# Patient Record
Sex: Female | Born: 2000 | Race: White | Hispanic: No | Marital: Single | Smoking: Never smoker
Health system: Southern US, Community
[De-identification: ages and names within clinical notes are randomized; demographics above are authoritative.]

## PROBLEM LIST (undated history)

## (undated) DIAGNOSIS — F411 Generalized anxiety disorder: Secondary | ICD-10-CM

## (undated) DIAGNOSIS — J40 Bronchitis, not specified as acute or chronic: Secondary | ICD-10-CM

## (undated) DIAGNOSIS — Z9109 Other allergy status, other than to drugs and biological substances: Secondary | ICD-10-CM

## (undated) DIAGNOSIS — R519 Headache, unspecified: Secondary | ICD-10-CM

## (undated) DIAGNOSIS — A4902 Methicillin resistant Staphylococcus aureus infection, unspecified site: Secondary | ICD-10-CM

## (undated) DIAGNOSIS — F329 Major depressive disorder, single episode, unspecified: Secondary | ICD-10-CM

## (undated) DIAGNOSIS — N390 Urinary tract infection, site not specified: Secondary | ICD-10-CM

## (undated) DIAGNOSIS — R635 Abnormal weight gain: Secondary | ICD-10-CM

## (undated) DIAGNOSIS — R51 Headache: Secondary | ICD-10-CM

## (undated) DIAGNOSIS — F32A Depression, unspecified: Secondary | ICD-10-CM

## (undated) HISTORY — DX: Bronchitis, not specified as acute or chronic: J40

## (undated) HISTORY — DX: Major depressive disorder, single episode, unspecified: F32.9

## (undated) HISTORY — DX: Depression, unspecified: F32.A

## (undated) HISTORY — DX: Abnormal weight gain: R63.5

## (undated) HISTORY — PX: CYST REMOVAL LEG: SHX6280

## (undated) HISTORY — DX: Generalized anxiety disorder: F41.1

## (undated) HISTORY — PX: CYST REMOVAL HAND: SHX6279

---
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
## 2021-06-02 ENCOUNTER — Emergency Department: Payer: Self-pay

## 2021-06-02 ENCOUNTER — Emergency Department
Admission: EM | Admit: 2021-06-02 | Discharge: 2021-06-02 | Disposition: A | Discharge status: Home / Self Care | Payer: Self-pay | Attending: Emergency Medicine | Admitting: Emergency Medicine

## 2021-06-02 ENCOUNTER — Encounter: Payer: Self-pay | Admitting: Emergency Medicine

## 2021-06-02 ENCOUNTER — Other Ambulatory Visit: Payer: Self-pay

## 2021-06-02 DIAGNOSIS — S93602A Unspecified sprain of left foot, initial encounter: Secondary | ICD-10-CM | POA: Insufficient documentation

## 2021-06-02 DIAGNOSIS — W108XXA Fall (on) (from) other stairs and steps, initial encounter: Secondary | ICD-10-CM | POA: Insufficient documentation

## 2021-06-02 DIAGNOSIS — Y9301 Activity, walking, marching and hiking: Secondary | ICD-10-CM | POA: Insufficient documentation

## 2021-06-02 MED ORDER — TRAMADOL HCL 50 MG PO TABS: 50.0000 mg | ORAL_TABLET | Freq: Four times a day (QID) | ORAL | 0 refills | Status: AC | PRN

## 2021-06-02 MED ORDER — TRAMADOL HCL 50 MG PO TABS: 50.0000 mg | ORAL_TABLET | Freq: Four times a day (QID) | ORAL | 0 refills | Status: DC | PRN

## 2021-06-02 MED ORDER — NAPROXEN 500 MG PO TABS: 500.0000 mg | ORAL_TABLET | Freq: Two times a day (BID) | ORAL | 0 refills | Status: DC

## 2021-06-02 MED ORDER — NAPROXEN 500 MG PO TABS: 500.0000 mg | ORAL_TABLET | Freq: Two times a day (BID) | ORAL | 0 refills | Status: AC

## 2021-06-02 NOTE — Discharge Instructions (Addendum)
Wear the post op shoe for the next week. If unable to bear weight after the week, follow up with podiatry.   Return to the ER for symptoms that change, worsen, or for new concerns.

## 2021-06-02 NOTE — ED Provider Notes (Signed)
[facility] Emergency Department Provider Note ____________________________________________  Time seen: Approximately 6:26 PM  I have reviewed the triage vital signs and the nursing notes.   HISTORY  Chief Complaint Fall and Foot Pain    HPI Emily Chang is a 20 y.o. female who presents to the emergency department for evaluation and treatment of left foot pain after tripping while walking down her steps and twisting her ankle/foot.  Injury occurred few hours prior to arrival.  No relief with Ace bandage.  Pain increases with attempts to bear weight.  Past Medical History:  Diagnosis Date   Anxiety state 05/19/2017   Bronchitis    Depression    diagnosed by therapist in [place]   Environmental allergies    Headache    MRSA (methicillin resistant Staphylococcus aureus)    Urinary tract infection    Weight gain, abnormal    45# in 6 months    Patient Active Problem List   Diagnosis Date Noted   Anxiety state 05/19/2017   Depressed mood 05/19/2017   Migraine without aura and without status migrainosus, not intractable 02/15/2017   Tension headache 02/15/2017   UTI (urinary tract infection) 03/29/2015    Past Surgical History:  Procedure Laterality Date   CYST REMOVAL HAND     CYST REMOVAL LEG      Prior to Admission medications   Medication Sig Start Date End Date Taking? Authorizing Provider  albuterol (PROVENTIL HFA;VENTOLIN HFA) 108 (90 Base) MCG/ACT inhaler Inhale 2 puffs into the lungs every 6 (six) hours as needed for wheezing or shortness of breath.    [provider]  beclomethasone (QVAR) 40 MCG/ACT inhaler Inhale 2 puffs into the lungs 2 (two) times daily.    [provider]  Cetirizine HCl (ZYRTEC ALLERGY PO) Take by mouth.    [provider]  etonogestrel (NEXPLANON) 68 MG IMPL implant Inject into the skin.    [provider]  fexofenadine-pseudoephedrine (ALLEGRA-D) 60-120 MG 12 hr tablet Take 1  tablet by mouth 2 (two) times daily. Patient not taking: Reported on 05/19/2017 10/18/16   Joni Reining, PA-C  fluticasone [facility]) 50 MCG/ACT nasal spray 1 spray by Each Nare route daily.    [provider]  fluticasone (FLOVENT HFA) 44 MCG/ACT inhaler Inhale into the lungs.    [provider]  Magnesium Oxide 500 MG TABS Take by mouth.    [provider]  montelukast (SINGULAIR) 10 MG tablet Take by mouth.    [provider]  naproxen (NAPROSYN) 500 MG tablet Take 1 tablet (500 mg total) by mouth 2 (two) times daily with a meal. 06/02/21   Trelon Plush B, FNP  riboflavin (VITAMIN B-2) 100 MG TABS tablet Take 100 mg by mouth daily.    [provider]  topiramate (TOPAMAX) 50 MG tablet Take 1 tablet (50 mg total) by mouth 2 (two) times daily. 05/19/17   Keturah Shavers, MD  traMADol (ULTRAM) 50 MG tablet Take 1 tablet (50 mg total) by mouth every 6 (six) hours as needed. 06/02/21   Chinita Pester, FNP    Allergies Iodine and Shellfish allergy  Family History  Problem Relation Age of Onset   Autism Brother    Seizures Brother    Stroke Maternal Grandmother    Cancer Maternal Grandmother    Diabetes Maternal Grandmother    Hypertension Maternal Grandmother    Hypertension Maternal Grandfather    Hypertension Paternal Grandmother    Cancer Paternal Grandfather  Hypertension Paternal Grandfather     Social History Social History   Tobacco Use   Smoking status: Never   Smokeless tobacco: Never  Substance Use Topics   Alcohol use: No   Drug use: No    Review of Systems Constitutional: Negative for fever. Cardiovascular: Negative for chest pain. Respiratory: Negative for shortness of breath. Musculoskeletal: Positive for left foot pain.  Negative for left ankle or knee pain. Skin: Negative for open wounds or lesions overlying the left foot and ankle. Neurological: Negative for decrease in  sensation  ____________________________________________   PHYSICAL EXAM:  VITAL SIGNS: ED Triage Vitals  Enc Vitals Group     BP 06/02/21 1730 120/66     Pulse Rate 06/02/21 1730 70     Resp 06/02/21 1730 20     Temp 06/02/21 1730 98 F (36.7 C)     Temp Source 06/02/21 1730 Oral     SpO2 06/02/21 1730 97 %     Weight 06/02/21 1726 250 lb (113.4 kg)     Height 06/02/21 1726 5\' 2"  (1.575 m)     Head Circumference --      Peak Flow --      Pain Score 06/02/21 1726 8     Pain Loc --      Pain Edu? --      Excl. in GC? --     Constitutional: Alert and oriented. Well appearing and in no acute distress. Eyes: Conjunctivae are clear without discharge or drainage Head: Atraumatic Neck: Supple. Respiratory: No cough. Respirations are even and unlabored. Musculoskeletal: Tenderness over the fourth and fifth metatarsals without obvious bony injury or deformity.  No tenderness over the left ankle.  Able to demonstrate full range of motion.  No tenderness to the left knee.  Demonstrates full range of motion. Neurologic: Awake, alert, oriented.  Motor and sensory function of the left lower extremity is intact. Skin: No open wounds or lesions over the left foot and ankle. Psychiatric: Affect and behavior are appropriate.  ____________________________________________   LABS (all labs ordered are listed, but only abnormal results are displayed)  Labs Reviewed - No data to display ____________________________________________  RADIOLOGY  Image of the left foot is negative for acute concerns.  I, 06/04/21, personally viewed and evaluated these images (plain radiographs) as part of my medical decision making, as well as reviewing the written report by the radiologist.  DG Foot Complete Left  Result Date: 06/02/2021 CLINICAL DATA:  Pain after fall today. Twisting injury with left foot pain. EXAM: LEFT FOOT - COMPLETE 3+ VIEW COMPARISON:  None. FINDINGS: There is no evidence of  fracture or dislocation. Incidental os navicular. Tiny plantar calcaneal spur. There is no evidence of arthropathy or other focal bone abnormality. Soft tissues are unremarkable. IMPRESSION: No fracture or subluxation of the left foot. Electronically Signed   By: 06/04/2021 M.D.   On: 06/02/2021 18:55   ____________________________________________   PROCEDURES  Procedures  ____________________________________________   INITIAL IMPRESSION / ASSESSMENT AND PLAN / ED COURSE  Emily Chang is a 20 y.o. who presents to the emergency department for treatment and evaluation of left foot injury.  See HPI for further details.  Image of the left foot shows no acute bony abnormality.  She will be placed in a Ace bandage and postop shoe.  Patient instructed to follow-up with podiatry if not improving over the week.  She was also instructed to return to the emergency department for symptoms that change  or worsen if unable schedule an appointment with podiatry or primary care.  Medications - No data to display  Pertinent labs & imaging results that were available during my care of the patient were reviewed by me and considered in my medical decision making (see chart for details).   _________________________________________   FINAL CLINICAL IMPRESSION(S) / ED DIAGNOSES  Final diagnoses:  Sprain of left foot, initial encounter    ED Discharge Orders          Ordered    traMADol (ULTRAM) 50 MG tablet  Every 6 hours PRN,   Status:  Discontinued        06/02/21 1929    naproxen (NAPROSYN) 500 MG tablet  2 times daily with meals,   Status:  Discontinued        06/02/21 1929    naproxen (NAPROSYN) 500 MG tablet  2 times daily with meals        06/02/21 1949    traMADol (ULTRAM) 50 MG tablet  Every 6 hours PRN        06/02/21 1949             If controlled substance prescribed during this visit, 12 month history viewed on the [facility] prior to issuing an initial prescription for  Schedule II or III opiod.    Chinita Pester, FNP 06/02/21 1953    Jene Every, MD 06/02/21 2006

## 2021-06-02 NOTE — ED Triage Notes (Signed)
Pt reports fell and hurt her left foot today. Pt states it is very painful to walk on.

## 2021-06-02 NOTE — ED Notes (Signed)
See triage note  Presents with pain to left foot  States she fell  Twisted foot  No deformity noted  Unable to bear wt d/t pain

## 2021-07-22 ENCOUNTER — Other Ambulatory Visit: Payer: Self-pay

## 2021-07-22 ENCOUNTER — Emergency Department
Admission: EM | Admit: 2021-07-22 | Discharge: 2021-07-22 | Disposition: A | Discharge status: Home / Self Care | Payer: Self-pay | Attending: Emergency Medicine | Admitting: Emergency Medicine

## 2021-07-22 DIAGNOSIS — M6740 Ganglion, unspecified site: Secondary | ICD-10-CM | POA: Insufficient documentation

## 2021-07-22 NOTE — Discharge Instructions (Signed)
You can take 600 mg of ibuprofen every 8 hours as needed for discomfort.

## 2021-07-22 NOTE — ED Triage Notes (Signed)
C/O bump to anterior wrist and base of thumb x 1 month.  Arrives today to be checked out.

## 2021-07-22 NOTE — ED Provider Notes (Signed)
[facility]-EMERGENCY DEPARTMENT  ____________________________________________  Time seen: Approximately 9:09 PM  I have reviewed the triage vital signs and the nursing notes.   HISTORY  Chief Complaint Hand Pain   Historian Patient    HPI Emily Chang is a 20 y.o. female presents to the emergency department with a 3 cm x 2 cm ganglion cyst that has developed over the past 3 weeks.  Patient states that the area is occasionally tender to palpation and she became concerned.  A soft and movable.  Patient denies a history of similar symptoms in the past.  She has not been under the care of orthopedics.   Past Medical History:  Diagnosis Date   Anxiety state 05/19/2017   Bronchitis    Depression    diagnosed by therapist in [place]   Environmental allergies    Headache    MRSA (methicillin resistant Staphylococcus aureus)    Urinary tract infection    Weight gain, abnormal    45# in 6 months     Immunizations up to date:  Yes.     Past Medical History:  Diagnosis Date   Anxiety state 05/19/2017   Bronchitis    Depression    diagnosed by therapist in [place]   Environmental allergies    Headache    MRSA (methicillin resistant Staphylococcus aureus)    Urinary tract infection    Weight gain, abnormal    45# in 6 months    Patient Active Problem List   Diagnosis Date Noted   Anxiety state 05/19/2017   Depressed mood 05/19/2017   Migraine without aura and without status migrainosus, not intractable 02/15/2017   Tension headache 02/15/2017   UTI (urinary tract infection) 03/29/2015    Past Surgical History:  Procedure Laterality Date   CYST REMOVAL HAND     CYST REMOVAL LEG      Prior to Admission medications   Medication Sig Start Date End Date Taking? Authorizing Provider  albuterol (PROVENTIL HFA;VENTOLIN HFA) 108 (90 Base) MCG/ACT inhaler Inhale 2 puffs into the lungs every 6 (six) hours as needed for wheezing or shortness of breath.    [provider]  beclomethasone (QVAR) 40 MCG/ACT inhaler Inhale 2 puffs into the lungs 2 (two) times daily.    [provider]  Cetirizine HCl (ZYRTEC ALLERGY PO) Take by mouth.    [provider]  etonogestrel (NEXPLANON) 68 MG IMPL implant Inject into the skin.    [provider]  fexofenadine-pseudoephedrine (ALLEGRA-D) 60-120 MG 12 hr tablet Take 1 tablet by mouth 2 (two) times daily. Patient not taking: Reported on 05/19/2017 10/18/16   Joni Reining, PA-C  fluticasone [facility]) 50 MCG/ACT nasal spray 1 spray by Each Nare route daily.    [provider]  fluticasone (FLOVENT HFA) 44 MCG/ACT inhaler Inhale into the lungs.    [provider]  Magnesium Oxide 500 MG TABS Take by mouth.    [provider]  montelukast (SINGULAIR) 10 MG tablet Take by mouth.    [provider]  naproxen (NAPROSYN) 500 MG tablet Take 1 tablet (500 mg total) by mouth 2 (two) times daily with a meal. 06/02/21   Triplett, Cari B, FNP  riboflavin (VITAMIN B-2) 100 MG TABS tablet Take 100 mg by mouth daily.    [provider]  topiramate (TOPAMAX) 50 MG tablet Take 1 tablet (50 mg total) by mouth 2 (two) times daily. 05/19/17   Keturah Shavers, MD  traMADol [facility]) 50 MG tablet Take  1 tablet (50 mg total) by mouth every 6 (six) hours as needed. 06/02/21   Kem Boroughs B, FNP    Allergies Iodine and Shellfish allergy  Family History  Problem Relation Age of Onset   Autism Brother    Seizures Brother    Stroke Maternal Grandmother    Cancer Maternal Grandmother    Diabetes Maternal Grandmother    Hypertension Maternal Grandmother    Hypertension Maternal Grandfather    Hypertension Paternal Grandmother    Cancer Paternal Grandfather    Hypertension Paternal Grandfather     Social History Social History   Tobacco Use   Smoking status: Never   Smokeless tobacco: Never  Substance Use Topics   Alcohol use: No   Drug use: No      Review of Systems  Constitutional: No fever/chills Eyes:  No discharge ENT: No upper respiratory complaints. Respiratory: no cough. No SOB/ use of accessory muscles to breath Gastrointestinal:   No nausea, no vomiting.  No diarrhea.  No constipation. Musculoskeletal: Negative for musculoskeletal pain. Skin: Negative for rash, abrasions, lacerations, ecchymosis.   ____________________________________________   PHYSICAL EXAM:  VITAL SIGNS: ED Triage Vitals  Enc Vitals Group     BP 07/22/21 1813 (!) 141/86     Pulse Rate 07/22/21 1813 77     Resp 07/22/21 1813 18     Temp 07/22/21 1813 (!) 97.4 F (36.3 C)     Temp Source 07/22/21 1813 Oral     SpO2 07/22/21 1813 99 %     Weight 07/22/21 1803 250 lb (113.4 kg)     Height 07/22/21 1803 5\' 2"  (1.575 m)     Head Circumference --      Peak Flow --      Pain Score 07/22/21 1802 6     Pain Loc --      Pain Edu? --      Excl. in GC? --      Constitutional: Alert and oriented. Well appearing and in no acute distress. Eyes: Conjunctivae are normal. PERRL. EOMI. Head: Atraumatic. ENT: Cardiovascular: Normal rate, regular rhythm. Normal S1 and S2.  Good peripheral circulation. Respiratory: Normal respiratory effort without tachypnea or retractions. Lungs CTAB. Good air entry to the bases with no decreased or absent breath sounds Gastrointestinal: Bowel sounds x 4 quadrants. Soft and nontender to palpation. No guarding or rigidity. No distention. Musculoskeletal: Full range of motion to all extremities. No obvious deformities noted Neurologic:  Normal for age. No gross focal neurologic deficits are appreciated.  Skin: Patient has a 3 cm x 2 cm soft tissue mass along the volar aspect of the left wrist that is soft and movable to palpation. Psychiatric: Mood and affect are normal for age. Speech and behavior are normal.   ____________________________________________   LABS (all labs ordered are listed, but only abnormal  results are displayed)  Labs Reviewed - No data to display ____________________________________________  EKG   ____________________________________________  RADIOLOGY   No results found.  ____________________________________________    PROCEDURES  Procedure(s) performed:     Procedures     Medications - No data to display   ____________________________________________   INITIAL IMPRESSION / ASSESSMENT AND PLAN / ED COURSE  Pertinent labs & imaging results that were available during my care of the patient were reviewed by me and considered in my medical decision making (see chart for details).      Assessment and plan Ganglion cyst 20 year old female presents to the emergency department with a  3 cm x 2 cm soft tissue mass along the volar aspect of the left wrist concerning for possible ganglion cyst.  Patient was advised to follow-up with hand specialist, Dr. Stephenie Acres.  Recommended 600 mg of ibuprofen every 6 hours as needed for discomfort.  All patient questions were answered.     ____________________________________________  FINAL CLINICAL IMPRESSION(S) / ED DIAGNOSES  Final diagnoses:  Ganglion cyst      NEW MEDICATIONS STARTED DURING THIS VISIT:  ED Discharge Orders     None           This chart was dictated using voice recognition software/Dragon. Despite best efforts to proofread, errors can occur which can change the meaning. Any change was purely unintentional.     Orvil Feil, PA-C 07/22/21 2111    Merwyn Katos, MD 07/22/21 2152

## 2021-09-22 ENCOUNTER — Ambulatory Visit: Payer: Self-pay

## 2021-10-16 ENCOUNTER — Ambulatory Visit: Payer: Self-pay

## 2023-02-03 IMAGING — DX DG FOOT COMPLETE 3+V*L*
3 series · 3 of 3 positions shown · non-contrast
Comparison: None.

CLINICAL DATA: Pain after fall today. Twisting injury with left
foot pain.

EXAM:
LEFT FOOT - COMPLETE 3+ VIEW

[foot ap]
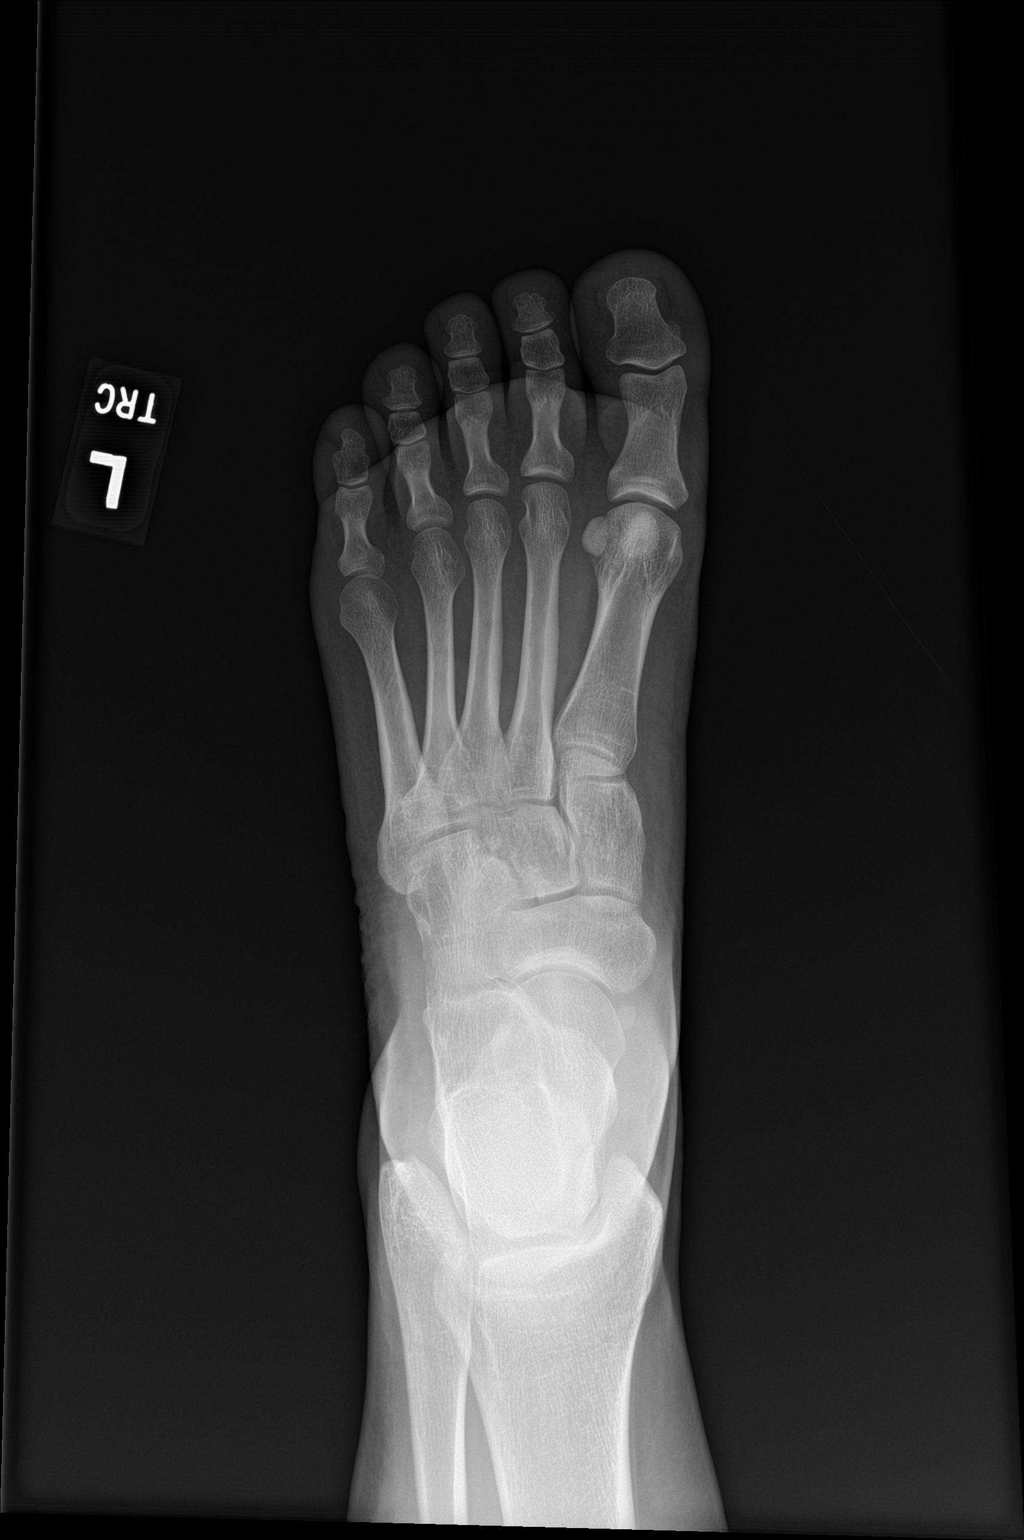

[foot obl]
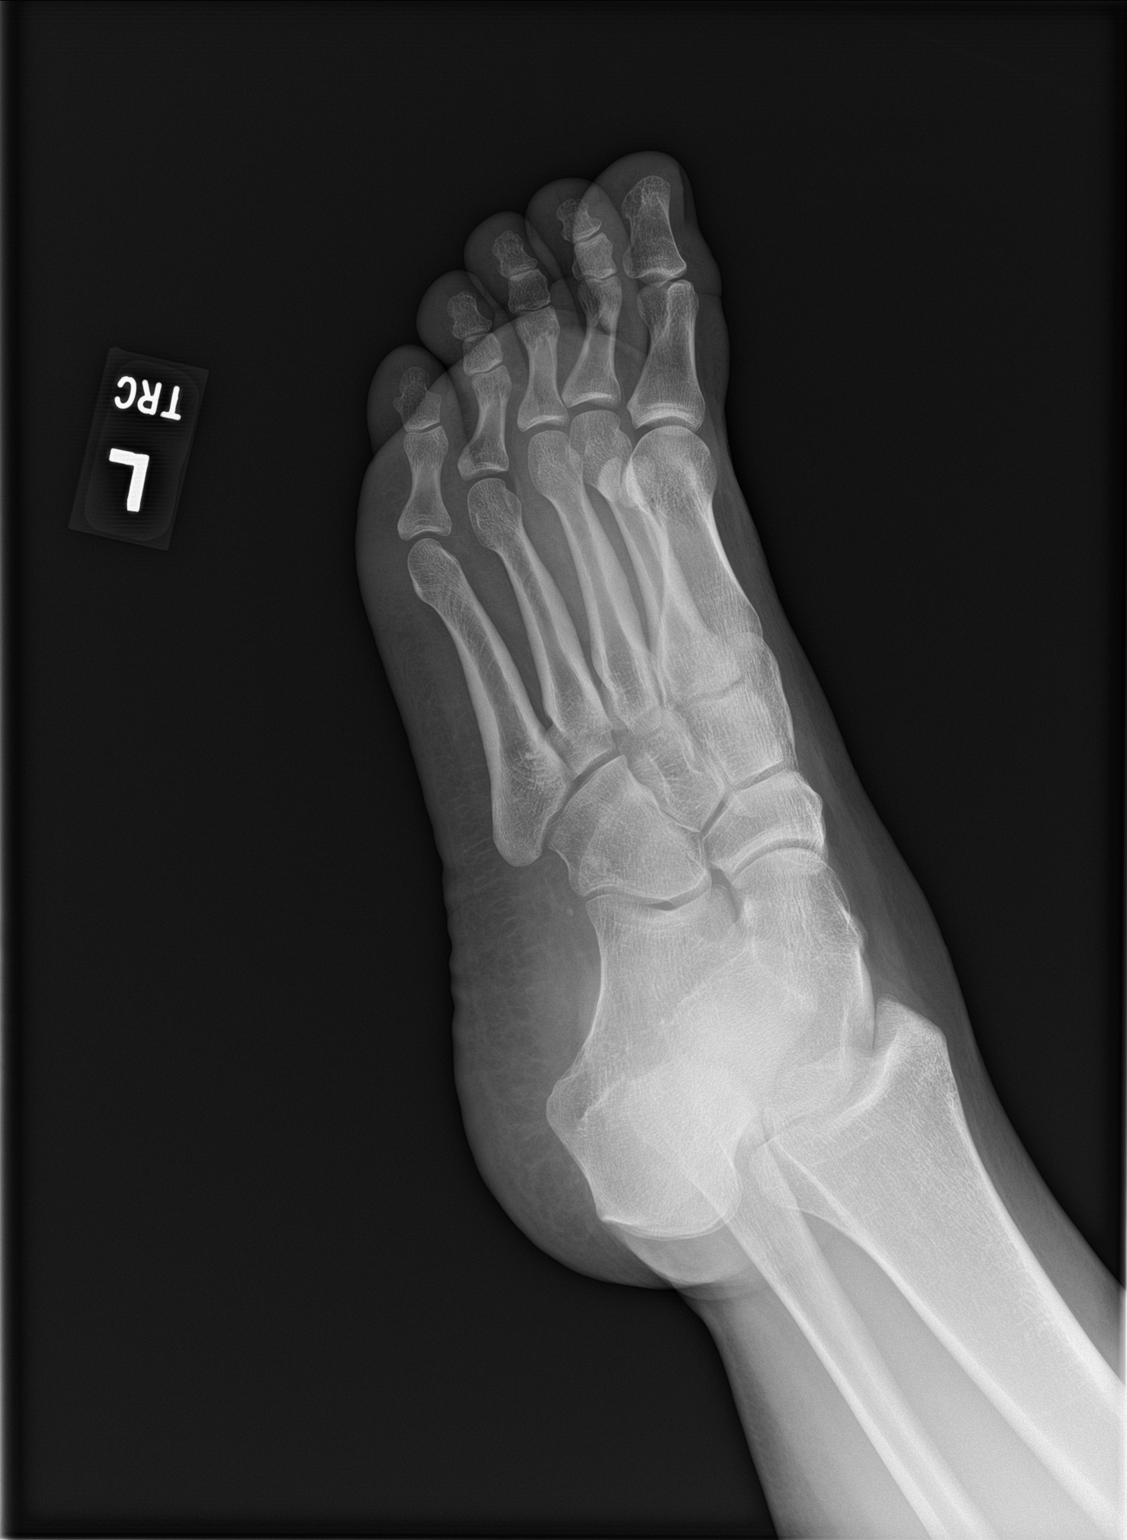

[foot lat]
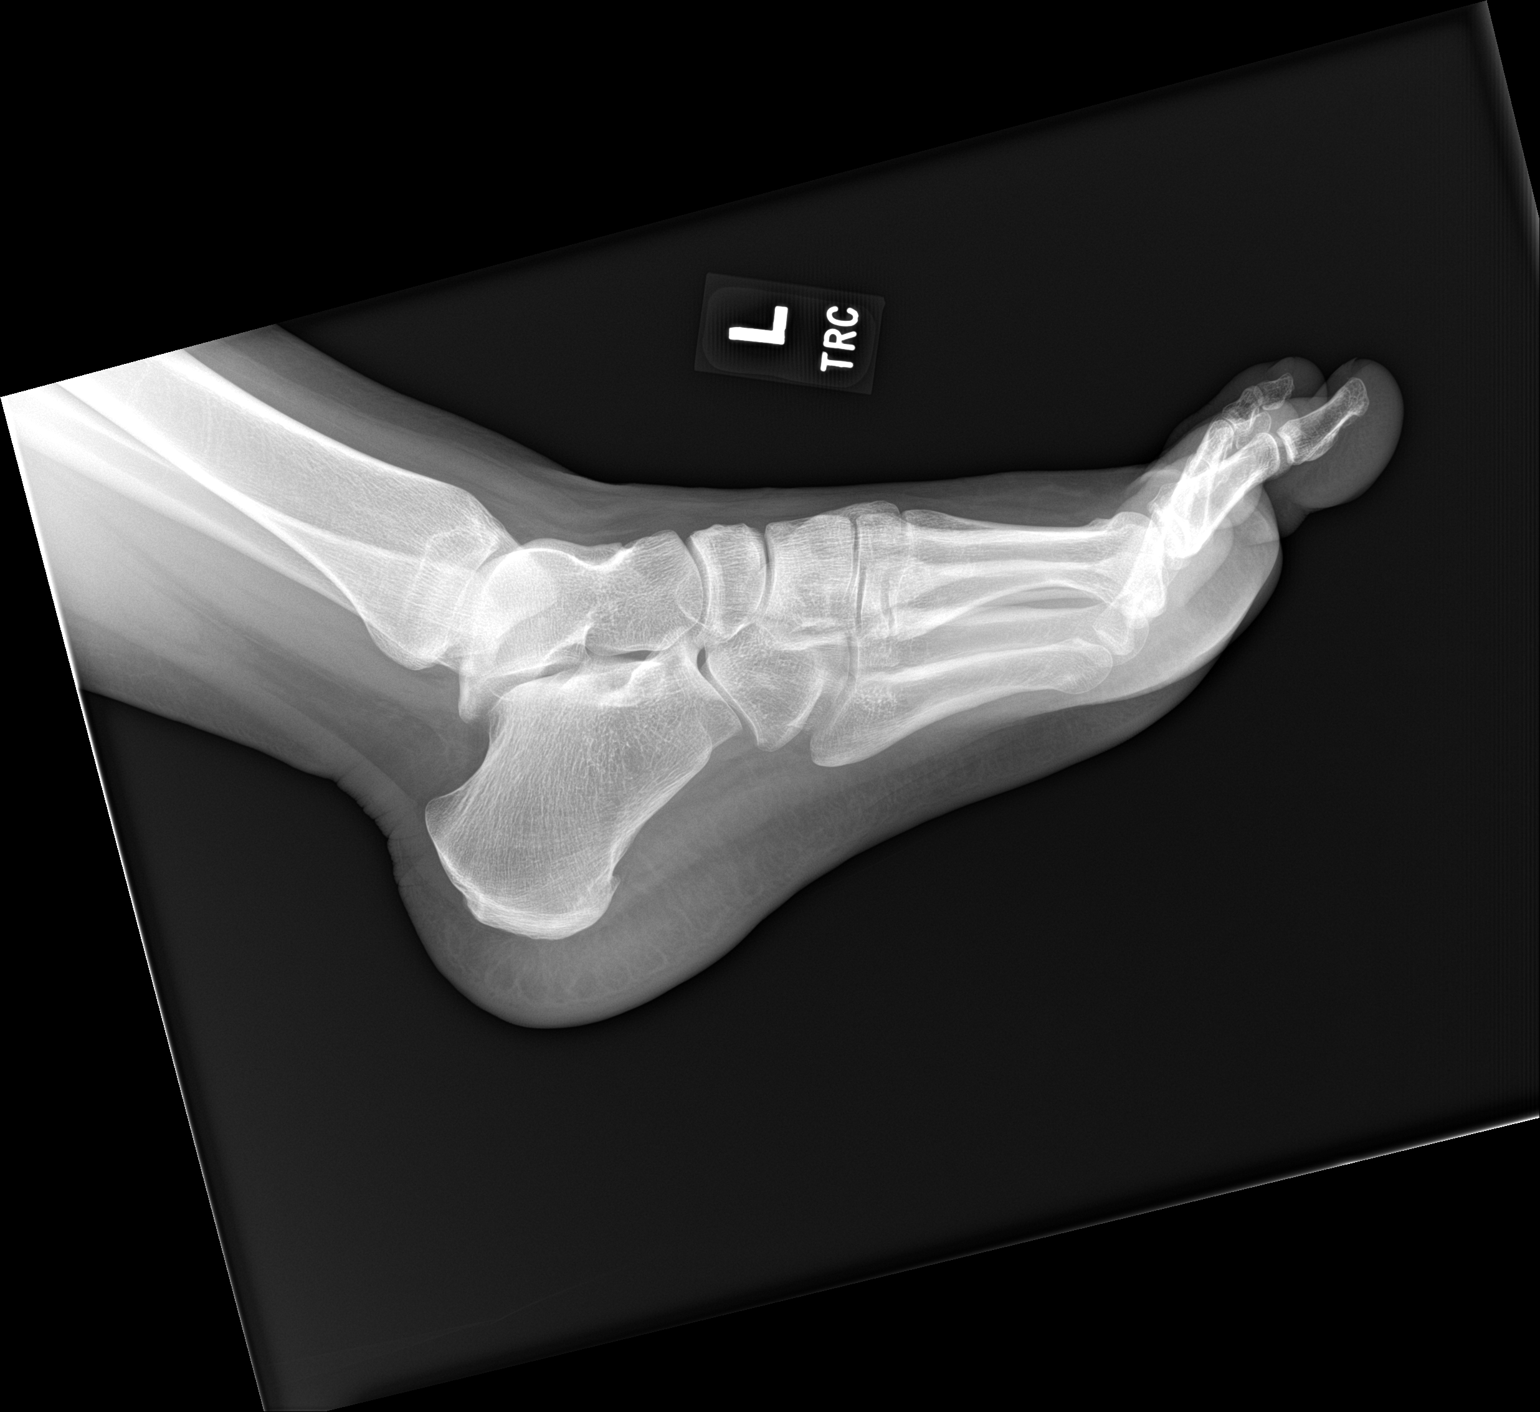

[3 of 3 positions shown; findings below may reference images not displayed]

FINDINGS: There is no evidence of fracture or dislocation. Incidental os
navicular. Tiny plantar calcaneal spur. There is no evidence of
arthropathy or other focal bone abnormality. Soft tissues are
unremarkable.
IMPRESSION: No fracture or subluxation of the left foot.
